# Patient Record
Sex: Male | Born: 1990 | Race: White | Hispanic: No | Marital: Single | Smoking: Current every day smoker
Health system: Southern US, Community
[De-identification: ages and names within clinical notes are randomized; demographics above are authoritative.]

## PROBLEM LIST (undated history)

## (undated) DIAGNOSIS — K047 Periapical abscess without sinus: Secondary | ICD-10-CM

---
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
## 2008-06-18 ENCOUNTER — Emergency Department: Payer: Self-pay | Admitting: Emergency Medicine

## 2008-08-16 ENCOUNTER — Emergency Department: Payer: Self-pay | Admitting: Emergency Medicine

## 2008-09-08 ENCOUNTER — Emergency Department: Payer: Self-pay | Admitting: Unknown Physician Specialty

## 2008-09-18 ENCOUNTER — Emergency Department: Payer: Self-pay | Admitting: Emergency Medicine

## 2008-10-29 ENCOUNTER — Emergency Department: Payer: Self-pay | Admitting: Emergency Medicine

## 2008-12-07 ENCOUNTER — Emergency Department: Payer: Self-pay | Admitting: Emergency Medicine

## 2009-03-04 ENCOUNTER — Emergency Department: Payer: Self-pay | Admitting: Emergency Medicine

## 2010-12-27 ENCOUNTER — Emergency Department: Payer: Self-pay | Admitting: *Deleted

## 2010-12-30 ENCOUNTER — Emergency Department: Payer: Self-pay | Admitting: Emergency Medicine

## 2011-01-07 ENCOUNTER — Emergency Department: Payer: Self-pay | Admitting: Unknown Physician Specialty

## 2013-04-27 ENCOUNTER — Emergency Department: Payer: Self-pay | Admitting: Emergency Medicine

## 2013-04-30 ENCOUNTER — Emergency Department: Payer: Self-pay | Admitting: Internal Medicine

## 2013-05-17 ENCOUNTER — Emergency Department: Payer: Self-pay | Admitting: Emergency Medicine

## 2013-07-25 ENCOUNTER — Emergency Department: Payer: Self-pay | Admitting: Emergency Medicine

## 2013-07-25 LAB — URINALYSIS, COMPLETE
BACTERIA: NONE SEEN
BLOOD: NEGATIVE
Bilirubin,UR: NEGATIVE
Glucose,UR: NEGATIVE mg/dL (ref 0–75)
Ketone: NEGATIVE
LEUKOCYTE ESTERASE: NEGATIVE
Nitrite: NEGATIVE
PH: 5 (ref 4.5–8.0)
PROTEIN: NEGATIVE
RBC,UR: <1 /HPF (ref 0–5)
SPECIFIC GRAVITY: 1.026 (ref 1.003–1.030)
Squamous Epithelial: NONE SEEN
WBC UR: <1 /HPF (ref 0–5)

## 2013-07-25 LAB — COMPREHENSIVE METABOLIC PANEL
ALK PHOS: 164 U/L — AB
ANION GAP: 1 — AB (ref 7–16)
Albumin: 4.1 g/dL (ref 3.4–5.0)
BUN: 16 mg/dL (ref 7–18)
Bilirubin,Total: 0.7 mg/dL (ref 0.2–1.0)
CO2: 30 mmol/L (ref 21–32)
Calcium, Total: 9.1 mg/dL (ref 8.5–10.1)
Chloride: 103 mmol/L (ref 98–107)
Creatinine: 0.99 mg/dL (ref 0.60–1.30)
GLUCOSE: 84 mg/dL (ref 65–99)
Osmolality: 269 (ref 275–301)
Potassium: 3.7 mmol/L (ref 3.5–5.1)
SGOT(AST): 27 U/L (ref 15–37)
SGPT (ALT): 32 U/L (ref 12–78)
Sodium: 134 mmol/L — ABNORMAL LOW (ref 136–145)
TOTAL PROTEIN: 8.1 g/dL (ref 6.4–8.2)

## 2013-07-25 LAB — CBC
HCT: 48.3 % (ref 40.0–52.0)
HGB: 16.2 g/dL (ref 13.0–18.0)
MCH: 30.4 pg (ref 26.0–34.0)
MCHC: 33.5 g/dL (ref 32.0–36.0)
MCV: 91 fL (ref 80–100)
Platelet: 174 10*3/uL (ref 150–440)
RBC: 5.34 10*6/uL (ref 4.40–5.90)
RDW: 14.6 % — ABNORMAL HIGH (ref 11.5–14.5)
WBC: 13.2 10*3/uL — ABNORMAL HIGH (ref 3.8–10.6)

## 2013-07-25 LAB — DRUG SCREEN, URINE
Amphetamines, Ur Screen: NEGATIVE (ref ?–1000)
BARBITURATES, UR SCREEN: NEGATIVE (ref ?–200)
Benzodiazepine, Ur Scrn: NEGATIVE (ref ?–200)
CANNABINOID 50 NG, UR ~~LOC~~: POSITIVE (ref ?–50)
COCAINE METABOLITE, UR ~~LOC~~: POSITIVE (ref ?–300)
MDMA (ECSTASY) UR SCREEN: NEGATIVE (ref ?–500)
Methadone, Ur Screen: NEGATIVE (ref ?–300)
OPIATE, UR SCREEN: NEGATIVE (ref ?–300)
PHENCYCLIDINE (PCP) UR S: NEGATIVE (ref ?–25)
Tricyclic, Ur Screen: NEGATIVE (ref ?–1000)

## 2013-07-25 LAB — ACETAMINOPHEN LEVEL

## 2013-07-25 LAB — SALICYLATE LEVEL: Salicylates, Serum: 2.6 mg/dL

## 2013-07-25 LAB — ETHANOL: Ethanol: <3 mg/dL

## 2013-10-08 ENCOUNTER — Emergency Department: Payer: Self-pay | Admitting: Emergency Medicine

## 2015-08-06 ENCOUNTER — Emergency Department: Payer: Self-pay

## 2015-08-06 ENCOUNTER — Emergency Department
Admission: EM | Admit: 2015-08-06 | Discharge: 2015-08-06 | Disposition: A | Discharge status: Home / Self Care | Payer: Self-pay | Attending: Emergency Medicine | Admitting: Emergency Medicine

## 2015-08-06 ENCOUNTER — Encounter: Payer: Self-pay | Admitting: Emergency Medicine

## 2015-08-06 DIAGNOSIS — R0789 Other chest pain: Secondary | ICD-10-CM

## 2015-08-06 DIAGNOSIS — F17210 Nicotine dependence, cigarettes, uncomplicated: Secondary | ICD-10-CM | POA: Insufficient documentation

## 2015-08-06 DIAGNOSIS — J069 Acute upper respiratory infection, unspecified: Secondary | ICD-10-CM

## 2015-08-06 LAB — CBC
HCT: 43.1 % (ref 40.0–52.0)
Hemoglobin: 14.3 g/dL (ref 13.0–18.0)
MCH: 28.5 pg (ref 26.0–34.0)
MCHC: 33.2 g/dL (ref 32.0–36.0)
MCV: 85.9 fL (ref 80.0–100.0)
Platelets: 147 10*3/uL — ABNORMAL LOW (ref 150–440)
RBC: 5.02 MIL/uL (ref 4.40–5.90)
RDW: 14 % (ref 11.5–14.5)
WBC: 13.1 10*3/uL — ABNORMAL HIGH (ref 3.8–10.6)

## 2015-08-06 LAB — COMPREHENSIVE METABOLIC PANEL
ALK PHOS: 110 U/L (ref 38–126)
ALT: 24 U/L (ref 17–63)
ANION GAP: 11 (ref 5–15)
AST: 26 U/L (ref 15–41)
Albumin: 4.1 g/dL (ref 3.5–5.0)
BUN: 13 mg/dL (ref 6–20)
CALCIUM: 9.3 mg/dL (ref 8.9–10.3)
CHLORIDE: 100 mmol/L — AB (ref 101–111)
CO2: 25 mmol/L (ref 22–32)
CREATININE: 0.99 mg/dL (ref 0.61–1.24)
Glucose, Bld: 125 mg/dL — ABNORMAL HIGH (ref 65–99)
Potassium: 4 mmol/L (ref 3.5–5.1)
SODIUM: 136 mmol/L (ref 135–145)
Total Bilirubin: 0.5 mg/dL (ref 0.3–1.2)
Total Protein: 7 g/dL (ref 6.5–8.1)

## 2015-08-06 LAB — TROPONIN I

## 2015-08-06 MED ORDER — ALBUTEROL SULFATE HFA 108 (90 BASE) MCG/ACT IN AERS: 2.0000 | INHALATION_SPRAY | Freq: Four times a day (QID) | RESPIRATORY_TRACT | Status: DC | PRN

## 2015-08-06 MED ORDER — CARISOPRODOL 350 MG PO TABS: 350.0000 mg | ORAL_TABLET | Freq: Three times a day (TID) | ORAL | Status: DC | PRN

## 2015-08-06 MED ORDER — KETOROLAC TROMETHAMINE 30 MG/ML IJ SOLN
30.0000 mg | Freq: Once | INTRAMUSCULAR | Status: AC
  Administered 2015-08-06: 30 mg via INTRAMUSCULAR
  Filled 2015-08-06: qty 1

## 2015-08-06 NOTE — Discharge Instructions (Signed)
Chest Wall Pain Chest wall pain is pain in or around the bones and muscles of your chest. Sometimes, an injury causes this pain. Sometimes, the cause may not be known. This pain may take several weeks or longer to get better. HOME CARE INSTRUCTIONS  Pay attention to any changes in your symptoms. Take these actions to help with your pain:   Rest as told by your health care provider.   Avoid activities that cause pain. These include any activities that use your chest muscles or your abdominal and side muscles to lift heavy items.   If directed, apply ice to the painful area:  Put ice in a plastic bag.  Place a towel between your skin and the bag.  Leave the ice on for 20 minutes, 2-3 times per day.  Take over-the-counter and prescription medicines only as told by your health care provider.  Do not use tobacco products, including cigarettes, chewing tobacco, and e-cigarettes. If you need help quitting, ask your health care provider.  Keep all follow-up visits as told by your health care provider. This is important. SEEK MEDICAL CARE IF:  You have a fever.  Your chest pain becomes worse.  You have new symptoms. SEEK IMMEDIATE MEDICAL CARE IF:  You have nausea or vomiting.  You feel sweaty or light-headed.  You have a cough with phlegm (sputum) or you cough up blood.  You develop shortness of breath.   This information is not intended to replace advice given to you by your health care provider. Make sure you discuss any questions you have with your health care provider.   Document Released: 06/09/2005 Document Revised: 02/28/2015 Document Reviewed: 09/04/2014 Elsevier Interactive Patient Education 2016 Elsevier [employer]  Upper Respiratory Infection, Adult Most upper respiratory infections (URIs) are caused by a virus. A URI affects the nose, throat, and upper air passages. The most common type of URI is often called "the common cold." HOME CARE   Take medicines only as told  by your doctor.  Gargle warm saltwater or take cough drops to comfort your throat as told by your doctor.  Use a warm mist humidifier or inhale steam from a shower to increase air moisture. This may make it easier to breathe.  Drink enough fluid to keep your pee (urine) clear or pale yellow.  Eat soups and other clear broths.  Have a healthy diet.  Rest as needed.  Go back to work when your fever is gone or your doctor says it is okay.  You may need to stay home longer to avoid giving your URI to others.  You can also wear a face mask and wash your hands often to prevent spread of the virus.  Use your inhaler more if you have asthma.  Do not use any tobacco products, including cigarettes, chewing tobacco, or electronic cigarettes. If you need help quitting, ask your doctor. GET HELP IF:  You are getting worse, not better.  Your symptoms are not helped by medicine.  You have chills.  You are getting more short of breath.  You have brown or red mucus.  You have yellow or brown discharge from your nose.  You have pain in your face, especially when you bend forward.  You have a fever.  You have puffy (swollen) neck glands.  You have pain while swallowing.  You have white areas in the back of your throat. GET HELP RIGHT AWAY IF:   You have very bad or constant:  Headache.  Ear pain.  Pain in your forehead, behind your eyes, and over your cheekbones (sinus pain). °¨ Chest pain. °· You have long-lasting (chronic) lung disease and any of the following: °¨ Wheezing. °¨ Long-lasting cough. °¨ Coughing up blood. °¨ A change in your usual mucus. °· You have a stiff neck. °· You have changes in your: °¨ Vision. °¨ Hearing. °¨ Thinking. °¨ Mood. °MAKE SURE YOU:  °· Understand these instructions. °· Will watch your condition. °· Will get help right away if you are not doing well or get worse. °  °This information is not intended to replace advice given to you by your health  care provider. Make sure you discuss any questions you have with your health care provider. °  °Document Released: 11/26/2007 Document Revised: 10/24/2014 Document Reviewed: 09/14/2013 °Elsevier Interactive Patient Education ©2016 Elsevier Inc. ° °

## 2015-08-06 NOTE — ED Notes (Signed)
States R sided chest pain x 1 week worse when coughs, hurts with movement. Denies injury. Denies fevers.

## 2015-08-06 NOTE — ED Notes (Signed)
Called for patient in waiting room.  No response.   

## 2015-08-06 NOTE — ED Provider Notes (Addendum)
[facility] Emergency Department Provider Note  ____________________________________________  Time seen: Approximately 7 PM  I have reviewed the triage vital signs and the nursing notes.   HISTORY  Chief Complaint Chest Pain    HPI Randy Michael. is a 25 y.o. male without any chronic medical problems who is presenting today with right-sided chest pain. He says the pain is been ongoing for about a week and is worse with movement and when he pushes himself up to a sitting position. He says the pain is sharp and severe at times. He says it is also worse with deep breathing. Is not associated with any nausea or vomiting. Says that he does have a cough with clear sputum production. Has been taking nitrofurantoin Tylenol without any relief. Says that he has other families were ill but is not been in for a close contact. Only a mild amount of rhinorrhea. No radiation of the chest pain.Denies taking any hormone supplementation. Denies any injury.   History reviewed. No pertinent past medical history.  There are no active problems to display for this patient.   History reviewed. No pertinent past surgical history.  No current outpatient prescriptions on file.  Allergies Sulfa antibiotics and Aspirin  No family history on file.  Social History Social History  Substance Use Topics  . Smoking status: Current Every Day Smoker -- 1.00 packs/day    Types: Cigarettes  . Smokeless tobacco: None  . Alcohol Use: Yes    Review of Systems Constitutional: No fever/chills Eyes: No visual changes. ENT: No sore throat. Cardiovascular: As above Respiratory: Denies shortness of breath. Gastrointestinal: No abdominal pain.  No nausea, no vomiting.  No diarrhea.  No constipation. Genitourinary: Negative for dysuria. Musculoskeletal: Negative for back pain. Skin: Negative for rash. Neurological: Negative for headaches, focal weakness or numbness.  10-point ROS  otherwise negative.  ____________________________________________   PHYSICAL EXAM:  VITAL SIGNS: ED Triage Vitals  Enc Vitals Group     BP 08/06/15 1649 173/99 mmHg     Pulse Rate 08/06/15 1649 86     Resp 08/06/15 1649 20     Temp 08/06/15 1649 97.7 F (36.5 C)     Temp Source 08/06/15 1649 Oral     SpO2 08/06/15 1649 100 %     Weight 08/06/15 1649 165 lb (74.844 kg)     Height 08/06/15 1649  (1.803 m)     Head Cir --      Peak Flow --      Pain Score 08/06/15 1649 9     Pain Loc --      Pain Edu? --      Excl. in GC? --     Constitutional: Alert and oriented. Well appearing and in no acute distress. Eyes: Conjunctivae are normal. PERRL. EOMI. Head: Atraumatic. Nose: No congestion/rhinnorhea. Mouth/Throat: Mucous membranes are moist.   Neck: No stridor.   Cardiovascular: Normal rate, regular rhythm. Grossly normal heart sounds.  Chest pain is reproducible with palpation on the right side. Respiratory: Normal respiratory effort.  No retractions. Lungs CTAB. Gastrointestinal: Soft and nontender. No distention. No abdominal bruits. No CVA tenderness. Musculoskeletal: No lower extremity tenderness nor edema.  No joint effusions. Neurologic:  Normal speech and language. No gross focal neurologic deficits are appreciated. No gait instability. Skin:  Skin is warm, dry and intact. No rash noted. Psychiatric: Mood and affect are normal. Speech and behavior are normal.  ____________________________________________   LABS (all labs ordered are listed,  but only abnormal results are displayed)  Labs Reviewed  CBC - Abnormal; Notable for the following:    WBC 13.1 (*)    Platelets 147 (*)    All other components within normal limits  COMPREHENSIVE METABOLIC PANEL - Abnormal; Notable for the following:    Chloride 100 (*)    Glucose, Bld 125 (*)    All other components within normal limits  TROPONIN I   ____________________________________________  EKG  ED ECG  REPORT I, Arelia Longest, the attending physician, personally viewed and interpreted this ECG.   Date: 08/06/2015  EKG Time: 1657  Rate: 83  Rhythm: normal sinus rhythm  Axis: Normal axis  Intervals:none  ST&T Change: Single T-wave inversion in aVL. No ST elevation or depression.  ____________________________________________  RADIOLOGY  Minimal bronchitic changes on the chest x-ray. ____________________________________________   PROCEDURES  ____________________________________________   INITIAL IMPRESSION / ASSESSMENT AND PLAN / ED COURSE  Pertinent labs & imaging results that were available during my care of the patient were reviewed by me and considered in my medical decision making (see chart for details).  Patient is PE RC negative.  Also with very reassuring lab workup. Likely chest wall pain. Likely pain secondary to URI. We'll give dose of Toradol here. We'll give her inhaler at home secondary to the patient saying he has a lot of coughing in the evening. We'll also give a muscle relaxer. knows to use muscle cream such as icy hot or Aspercreme for further relief. Will be discharged home. ____________________________________________   FINAL CLINICAL IMPRESSION(S) / ED DIAGNOSES  URI. Chest wall pain.    Myrna Blazer, MD 08/06/15 [phone]  Patient also with blood pressure elevated but in the context of painful stimuli. We'll refer her to outpatient clinic for follow-up.  Myrna Blazer, MD 08/06/15 Jerene Bears

## 2015-10-10 ENCOUNTER — Encounter: Payer: Self-pay | Admitting: Medical Oncology

## 2015-10-10 ENCOUNTER — Emergency Department: Payer: Self-pay

## 2015-10-10 ENCOUNTER — Emergency Department
Admission: EM | Admit: 2015-10-10 | Discharge: 2015-10-10 | Disposition: A | Discharge status: Home / Self Care | Payer: Self-pay | Attending: Emergency Medicine | Admitting: Emergency Medicine

## 2015-10-10 DIAGNOSIS — Y939 Activity, unspecified: Secondary | ICD-10-CM | POA: Insufficient documentation

## 2015-10-10 DIAGNOSIS — S62346A Nondisplaced fracture of base of fifth metacarpal bone, right hand, initial encounter for closed fracture: Secondary | ICD-10-CM | POA: Insufficient documentation

## 2015-10-10 DIAGNOSIS — Y999 Unspecified external cause status: Secondary | ICD-10-CM | POA: Insufficient documentation

## 2015-10-10 DIAGNOSIS — W1839XA Other fall on same level, initial encounter: Secondary | ICD-10-CM | POA: Insufficient documentation

## 2015-10-10 DIAGNOSIS — Z7951 Long term (current) use of inhaled steroids: Secondary | ICD-10-CM | POA: Insufficient documentation

## 2015-10-10 DIAGNOSIS — Y929 Unspecified place or not applicable: Secondary | ICD-10-CM | POA: Insufficient documentation

## 2015-10-10 DIAGNOSIS — F17210 Nicotine dependence, cigarettes, uncomplicated: Secondary | ICD-10-CM | POA: Insufficient documentation

## 2015-10-10 DIAGNOSIS — S62339A Displaced fracture of neck of unspecified metacarpal bone, initial encounter for closed fracture: Secondary | ICD-10-CM

## 2015-10-10 MED ORDER — TRAMADOL HCL 50 MG PO TABS: 50.0000 mg | ORAL_TABLET | Freq: Four times a day (QID) | ORAL | Status: DC | PRN

## 2015-10-10 NOTE — ED Provider Notes (Signed)
CSN: [id number]     Arrival date & time 10/10/15  1310 History   First MD Initiated Contact with Patient 10/10/15 1330     Chief Complaint  Patient presents with  . Hand Pain     HPI   25 year old male who reports that he "fell on the concrete and landed on his right hand last night." He has swelling near the proximal fifth metacarpal. He denies history of boxer's fracture. He has not taken anything today to help with the pain.  History reviewed. No pertinent past medical history. History reviewed. No pertinent past surgical history. No family history on file. Social History  Substance Use Topics  . Smoking status: Current Every Day Smoker -- 1.00 packs/day    Types: Cigarettes  . Smokeless tobacco: None  . Alcohol Use: Yes    Review of Systems  Constitutional: Negative.   Respiratory: Negative for shortness of breath.   Musculoskeletal: Positive for myalgias. Negative for neck pain.  Skin: Negative for color change and wound.  Neurological: Negative for numbness.      Allergies  Sulfa antibiotics and Aspirin  Home Medications   Prior to Admission medications   Medication Sig Start Date End Date Taking? Authorizing Provider  albuterol (PROVENTIL HFA;VENTOLIN HFA) 108 (90 Base) MCG/ACT inhaler Inhale 2 puffs into the lungs every 6 (six) hours as needed for wheezing or shortness of breath. 08/06/15   Myrna Blazeravid Matthew Schaevitz, MD  carisoprodol (SOMA) 350 MG tablet Take 1 tablet (350 mg total) by mouth 3 (three) times daily as needed for muscle spasms. 08/06/15   Myrna Blazeravid Matthew Schaevitz, MD  traMADol (ULTRAM) 50 MG tablet Take 1 tablet (50 mg total) by mouth every 6 (six) hours as needed. 10/10/15   Merel Santoli B Anselmo Reihl, FNP   BP 152/98 mmHg  Pulse 84  Temp(Src) 98.1 F (36.7 C) (Oral)  Resp 18  Ht 5\' 11"  (1.803 m)  Wt 74.844 kg  BMI 23.02 kg/m2  SpO2 100% Physical Exam  Constitutional: He is oriented to person, place, and time. He appears well-developed.  Eyes: EOM are  normal.  Neck: Normal range of motion.  Pulmonary/Chest: Effort normal.  Musculoskeletal:  Bony tenderness over the proximal fifth metacarpal with obvious deformity. Radial pulse in the right wrist is 2+. There is no open lesion in association with the deformity.  Neurological: He is alert and oriented to person, place, and time.  Skin: Skin is warm and dry. No erythema.  Psychiatric: He has a normal mood and affect. His behavior is normal. Judgment and thought content normal.  Nursing note and vitals reviewed.   ED Course  .Splint Application Date/Time: 10/10/2015 3:00 PM Performed by: Jari SportsmanBANGLE, TAMMY Authorized by: Kem BoroughsRIPLETT, Leyton Brownlee B Consent: Verbal consent obtained. Consent given by: patient Location details: right hand Post-procedure: The splinted body part was neurovascularly unchanged following the procedure. Patient tolerance: Patient tolerated the procedure well with no immediate complications   (including critical care time) Labs Review Labs Reviewed - No data to display  Imaging Review Dg Hand Complete Right  10/10/2015  CLINICAL DATA:  25 year old male with history of trauma from a fall yesterday evening onto a sidewalk with injury to the right hand complaining of pain and swelling in the right hand since that time. EXAM: RIGHT HAND - COMPLETE 3+ VIEW COMPARISON:  Right thumb radiograph 10/08/2013. Right hand radiograph 12/07/2008. FINDINGS: Subtle nondisplaced fracture through the base of the fifth metacarpal. Mild posttraumatic deformity of the distal aspect of the fifth metacarpal related to  an old healed boxer's fracture. No other acute displaced fracture, subluxation or dislocation is noted. Soft tissue swelling adjacent to the fifth metacarpal is noted. IMPRESSION: 1. Acute nondisplaced fracture through the base of the fifth metacarpal. Electronically Signed   By: Trudie Reed M.D.   On: 10/10/2015 13:56   I have personally reviewed and evaluated these images and lab  results as part of my medical decision-making.   EKG Interpretation None      MDM   Final diagnoses:  Boxer's fracture, closed, initial encounter    Patient to follow up with orthopedics. He is to return to the ER for symptoms of concern if unable to schedule an appointment with the specialist.    Chinita Pester, FNP 10/10/15 1534  Myrna Blazer, MD 10/10/15 [phone]

## 2015-10-10 NOTE — Discharge Instructions (Signed)
Cast or Splint Care °Casts and splints support injured limbs and keep bones from moving while they heal.  °HOME CARE °· Keep the cast or splint uncovered during the drying period. °¨ A plaster cast can take 24 to 48 hours to dry. °¨ A fiberglass cast will dry in less than 1 hour. °· Do not rest the cast on anything harder than a pillow for 24 hours. °· Do not put weight on your injured limb. Do not put pressure on the cast. Wait for your doctor's approval. °· Keep the cast or splint dry. °¨ Cover the cast or splint with a plastic bag during baths or wet weather. °¨ If you have a cast over your chest and belly (trunk), take sponge baths until the cast is taken off. °¨ If your cast gets wet, dry it with a towel or blow dryer. Use the cool setting on the blow dryer. °· Keep your cast or splint clean. Wash a dirty cast with a damp cloth. °· Do not put any objects under your cast or splint. °· Do not scratch the skin under the cast with an object. If itching is a problem, use a blow dryer on a cool setting over the itchy area. °· Do not trim or cut your cast. °· Do not take out the padding from inside your cast. °· Exercise your joints near the cast as told by your doctor. °· Raise (elevate) your injured limb on 1 or 2 pillows for the first 1 to 3 days. °GET HELP IF: °· Your cast or splint cracks. °· Your cast or splint is too tight or too loose. °· You itch badly under the cast. °· Your cast gets wet or has a soft spot. °· You have a bad smell coming from the cast. °· You get an object stuck under the cast. °· Your skin around the cast becomes red or sore. °· You have new or more pain after the cast is put on. °GET HELP RIGHT AWAY IF: °· You have fluid leaking through the cast. °· You cannot move your fingers or toes. °· Your fingers or toes turn blue or white or are cool, painful, or puffy (swollen). °· You have tingling or lose feeling (numbness) around the injured area. °· You have bad pain or pressure under the  cast. °· You have trouble breathing or have shortness of breath. °· You have chest pain. °  °This information is not intended to replace advice given to you by your health care provider. Make sure you discuss any questions you have with your health care provider. °  °Document Released: 10/09/2010 Document Revised: 02/09/2013 Document Reviewed: 12/16/2012 °Elsevier Interactive Patient Education ©2016 Elsevier Inc. ° °

## 2015-10-10 NOTE — ED Notes (Signed)
Pt reports he fell on his rt hand last night. Swelling noted.

## 2017-01-29 IMAGING — CR DG CHEST 2V
1 series · 2 of 2 positions shown · non-contrast
Comparison: 12/27/2010

CLINICAL DATA: RIGHT-sided chest pain for 1 week, smoker

EXAM:
CHEST  2 VIEW

[Series 1: w chest pa · 0.14mm/px · 2 of 2 slices shown]
[im 1/2]
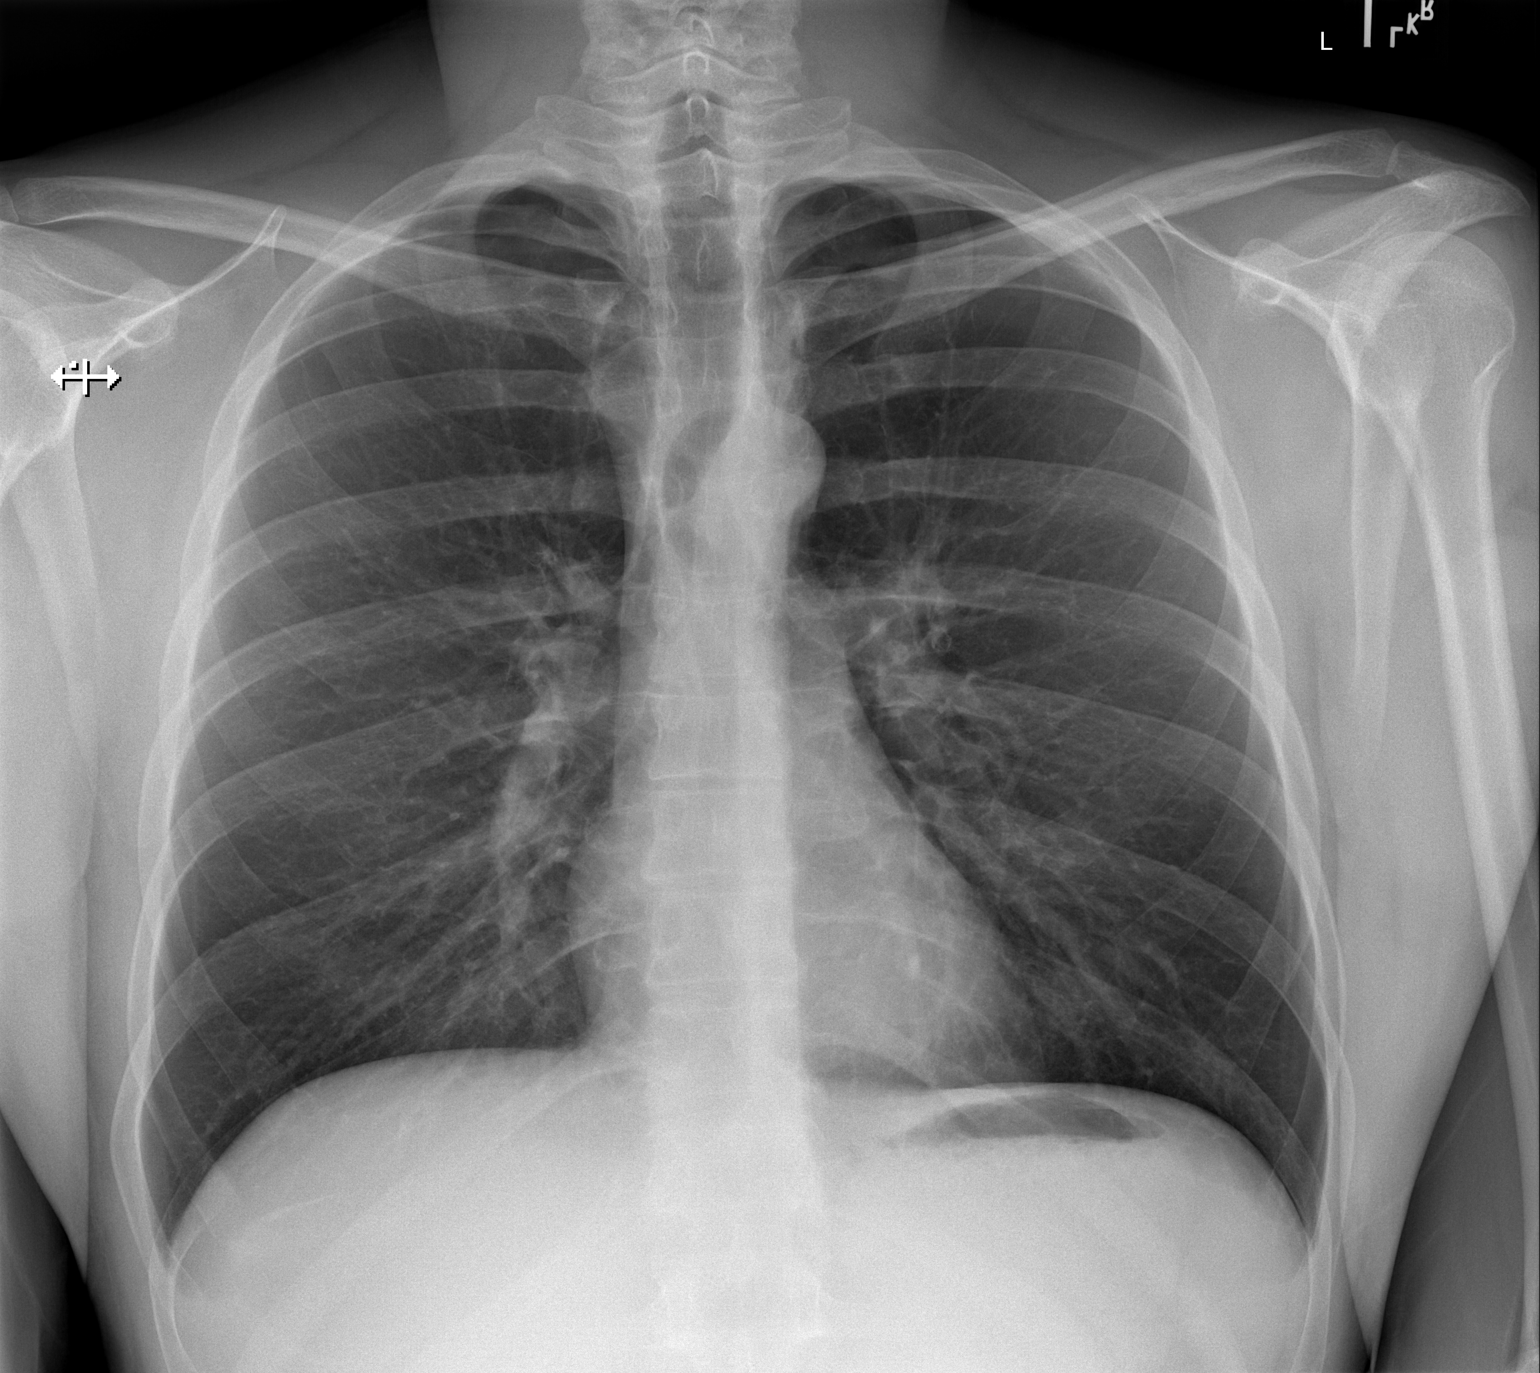
[im 2/2]
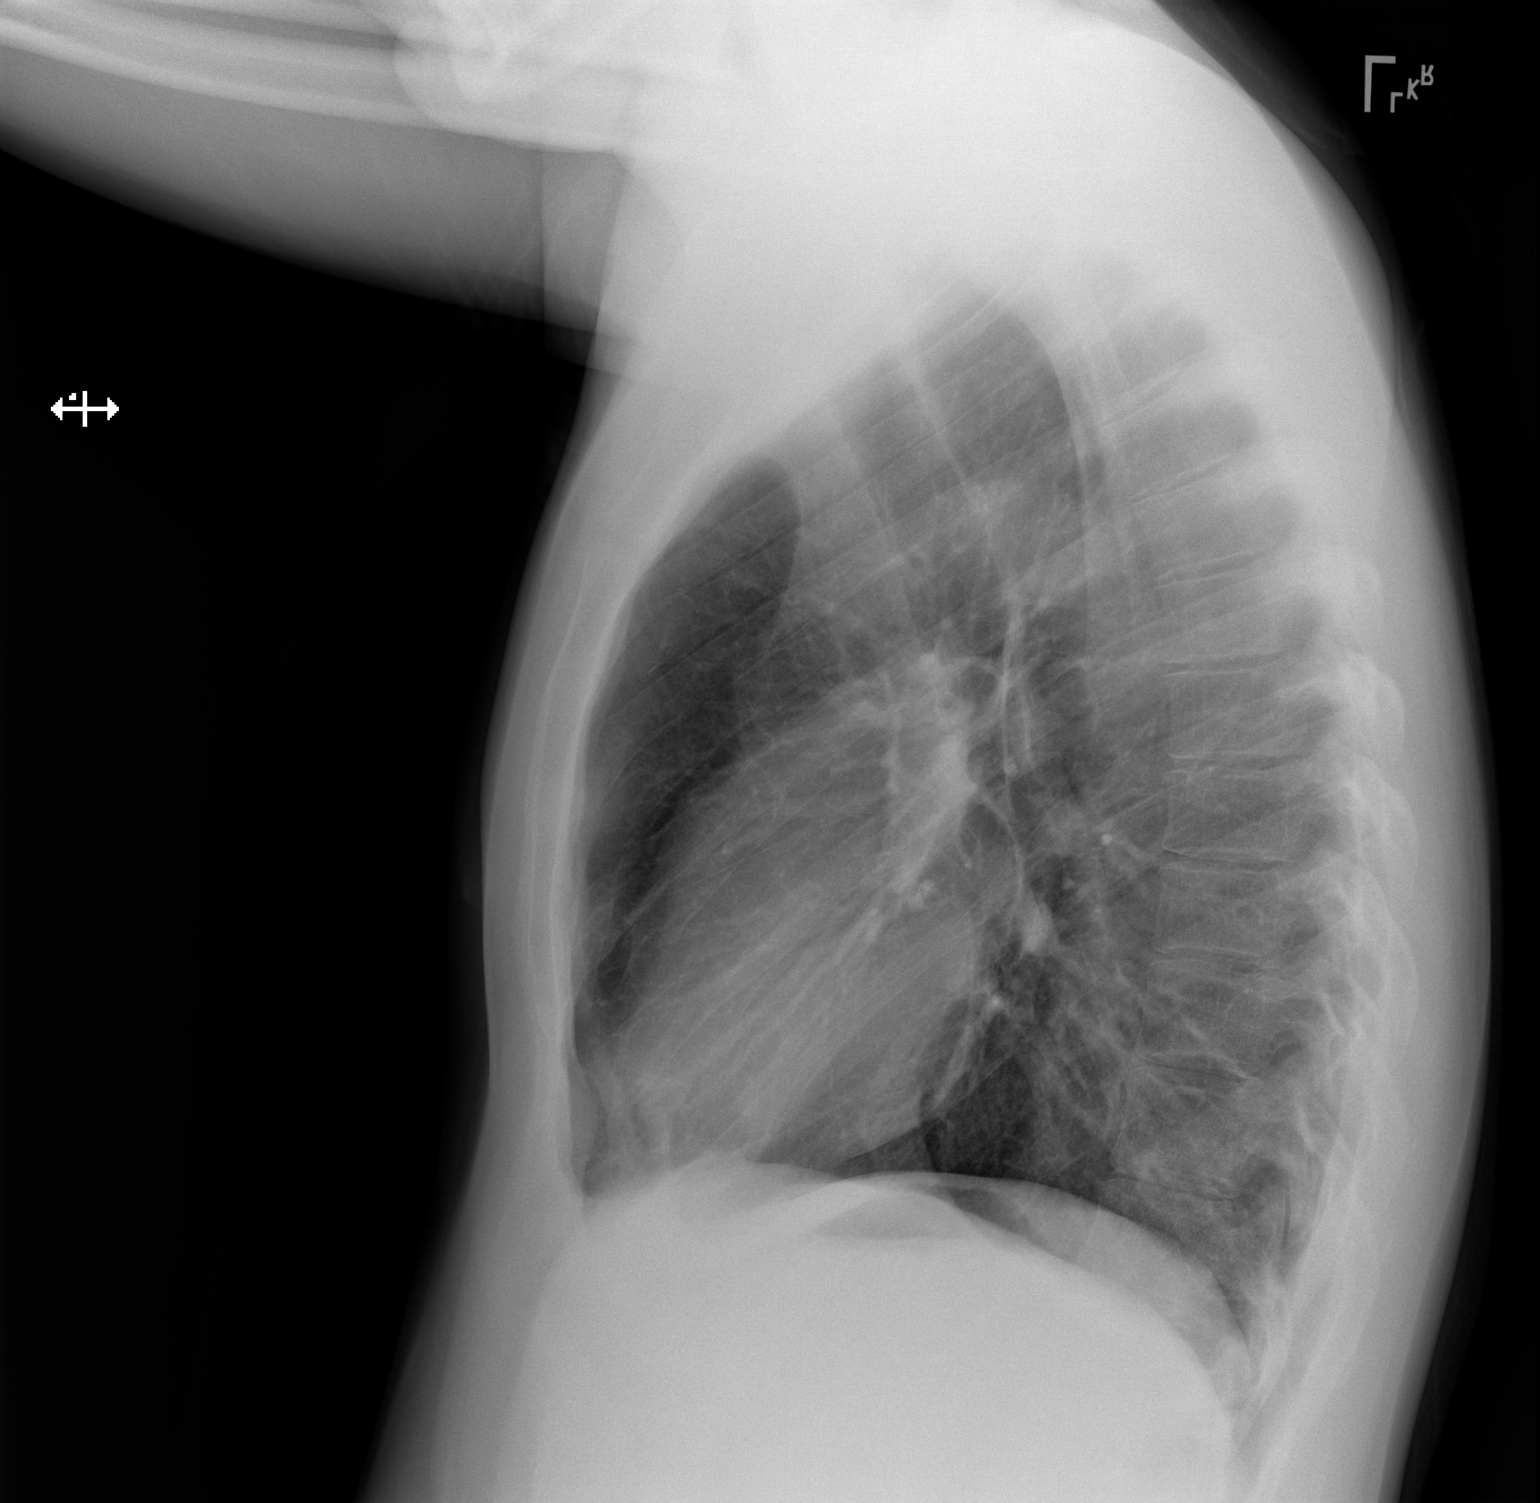

[2 of 2 positions shown; findings below may reference images not displayed]

FINDINGS: Normal heart size, mediastinal contours, and pulmonary vascularity.

Minimal peribronchial thickening.

No pulmonary infiltrate, pleural effusion or pneumothorax.

Bones unremarkable.
IMPRESSION: Minimal bronchitic changes.

## 2017-06-14 ENCOUNTER — Emergency Department: Payer: Self-pay

## 2017-06-14 ENCOUNTER — Other Ambulatory Visit: Payer: Self-pay

## 2017-06-14 ENCOUNTER — Emergency Department
Admission: EM | Admit: 2017-06-14 | Discharge: 2017-06-14 | Disposition: A | Discharge status: Home / Self Care | Payer: Self-pay | Attending: Emergency Medicine | Admitting: Emergency Medicine

## 2017-06-14 ENCOUNTER — Encounter: Payer: Self-pay | Admitting: Emergency Medicine

## 2017-06-14 DIAGNOSIS — F17210 Nicotine dependence, cigarettes, uncomplicated: Secondary | ICD-10-CM | POA: Insufficient documentation

## 2017-06-14 DIAGNOSIS — Y92000 Kitchen of unspecified non-institutional (private) residence as  the place of occurrence of the external cause: Secondary | ICD-10-CM | POA: Insufficient documentation

## 2017-06-14 DIAGNOSIS — Y939 Activity, unspecified: Secondary | ICD-10-CM | POA: Insufficient documentation

## 2017-06-14 DIAGNOSIS — J0100 Acute maxillary sinusitis, unspecified: Secondary | ICD-10-CM | POA: Insufficient documentation

## 2017-06-14 DIAGNOSIS — S0990XA Unspecified injury of head, initial encounter: Secondary | ICD-10-CM

## 2017-06-14 DIAGNOSIS — S0101XA Laceration without foreign body of scalp, initial encounter: Secondary | ICD-10-CM | POA: Insufficient documentation

## 2017-06-14 DIAGNOSIS — Y999 Unspecified external cause status: Secondary | ICD-10-CM | POA: Insufficient documentation

## 2017-06-14 MED ORDER — LIDOCAINE-EPINEPHRINE (PF) 1 %-1:200000 IJ SOLN
INTRAMUSCULAR | Status: AC
  Administered 2017-06-14: 5 mL
  Filled 2017-06-14: qty 30

## 2017-06-14 MED ORDER — AMOXICILLIN 500 MG PO CAPS: 500.0000 mg | ORAL_CAPSULE | Freq: Three times a day (TID) | ORAL | 0 refills | Status: DC

## 2017-06-14 NOTE — ED Notes (Signed)
Patient declined recheck of vital signs. 
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
## 2017-06-14 NOTE — ED Notes (Signed)
Dr. Darnelle CatalanMalinda aware of vital signs. Patient's head wounds were cleaned. Patient tolerated procedure well.
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
## 2017-06-23 ENCOUNTER — Other Ambulatory Visit: Payer: Self-pay

## 2017-06-23 ENCOUNTER — Emergency Department
Admission: EM | Admit: 2017-06-23 | Discharge: 2017-06-23 | Disposition: A | Discharge status: Home / Self Care | Payer: Self-pay | Attending: Emergency Medicine | Admitting: Emergency Medicine

## 2017-06-23 ENCOUNTER — Encounter: Payer: Self-pay | Admitting: Emergency Medicine

## 2017-06-23 DIAGNOSIS — F17210 Nicotine dependence, cigarettes, uncomplicated: Secondary | ICD-10-CM | POA: Insufficient documentation

## 2017-06-23 DIAGNOSIS — K047 Periapical abscess without sinus: Secondary | ICD-10-CM | POA: Insufficient documentation

## 2017-06-23 MED ORDER — CLINDAMYCIN HCL 150 MG PO CAPS: ORAL_CAPSULE | ORAL | 0 refills | Status: DC

## 2017-06-23 MED ORDER — OXYCODONE-ACETAMINOPHEN 5-325 MG PO TABS
1.0000 | ORAL_TABLET | Freq: Once | ORAL | Status: AC
  Administered 2017-06-23: 1 via ORAL
  Filled 2017-06-23: qty 1

## 2017-06-23 MED ORDER — OXYCODONE-ACETAMINOPHEN 5-325 MG PO TABS: 1.0000 | ORAL_TABLET | Freq: Four times a day (QID) | ORAL | 0 refills | Status: DC | PRN

## 2017-06-23 MED ORDER — CLINDAMYCIN PHOSPHATE 300 MG/2ML IJ SOLN
600.0000 mg | Freq: Once | INTRAMUSCULAR | Status: AC
  Administered 2017-06-23: 600 mg via INTRAMUSCULAR
  Filled 2017-06-23: qty 4

## 2017-06-23 NOTE — Discharge Instructions (Signed)
Follow-up with one of the dentist listed on your dental papers. Begin taking antibiotics until completely finished.  OPTIONS FOR DENTAL FOLLOW UP CARE  [place] [facility] - Local Safety Net Dental Clinics [email or web address]   [facility] [phone])  [facility] [phone])  [place] [phone] ext [phone])  [facility] [phone])  [facility] [phone]) This clinic caters to the indigent population and is on a lottery system. Location: [employer] [facility], [employer], [address], [place] Clinic Hours: Wednesdays from 6pm - 9pm, patients seen by a lottery system. For dates, call or go to [email or web address] Services: Cleanings, fillings and simple extractions. Payment Options: DENTAL WORK IS FREE OF CHARGE. Bring proof of income or support. Best way to get seen: Arrive at 5:15 pm - this is a lottery, NOT first come/first serve, so arriving earlier will not increase your chances of being seen.     [facility] [phone] Select option 1 for emergencies   Location: [facility], [place], [address] [place] Clinic Hours: No walk-ins accepted - call the day before to schedule an appointment. Check in times are 9:30 am and 1:30 pm. Services: Simple extractions, temporary fillings, pulpectomy/pulp debridement, uncomplicated abscess drainage. Payment Options: PAYMENT IS DUE AT THE TIME OF SERVICE.  Fee is usually $100-200, additional surgical procedures (e.g. abscess drainage) may be extra. Cash, checks, Visa/MasterCard accepted.  Can file Medicaid if patient is covered for dental - patient should call case worker to check. No discount for [facility] patients. Best way to get seen: MUST call the day before and get onto the schedule. Can  usually be seen the next 1-2 days. No walk-ins accepted.     [facility] [phone]   Location: [facility], [address], [place] Clinic Hours: M, W, Th, F 8am or 1:30pm, Tues 9a or 1:30 - first come/first served. Services: Simple extractions, temporary fillings, uncomplicated abscess drainage.  You do not need to be an [facility] resident. Payment Options: PAYMENT IS DUE AT THE TIME OF SERVICE. Dental insurance, otherwise sliding scale - bring proof of income or support. Depending on income and treatment needed, cost is usually $50-200. Best way to get seen: Arrive early as it is first come/first served.     [facility] [phone]   Location: [address] Clinic Hours: Mon-Thu 8a-5p Services: Most basic dental services including extractions and fillings. Payment Options: PAYMENT IS DUE AT THE TIME OF SERVICE. Sliding scale, up to 50% off - bring proof if income or support. Medicaid with dental option accepted. Best way to get seen: Call to schedule an appointment, can usually be seen within 2 weeks OR they will try to see walk-ins - show up at 8a or 2p (you may have to wait).     [facility] [phone] [place] RESIDENTS ONLY   Location: [facility], [address], [place], [place] [address] Clinic Hours: By appointment only. Monday - Thursday 8am-5pm, Friday 8am-12pm Services: Cleanings, fillings, extractions. Payment Options: PAYMENT IS DUE AT THE TIME OF SERVICE. Cash, Visa or MasterCard. Sliding scale - $30 minimum per service. Best way to get seen: Come in to office, complete packet and make an appointment - need proof of income or support monies for each household member and proof of [facility] residence. Usually takes about a month to  get in.     [facility] [phone]   Location: [address] [facility] Hours: Walk-in Urgent Care Dental Services are offered Monday-Friday mornings only. The numbers of emergencies accepted daily is limited to the number of providers available. Maximum 15 - Mondays, Wednesdays & Thursdays Maximum 10 - Tuesdays & Fridays Services: You do not need to be a [place] resident to be seen for a dental emergency. Emergencies are defined as pain, swelling, abnormal bleeding, or dental trauma. Walkins will receive x-rays if needed. NOTE: Dental cleaning is not an emergency. Payment Options: PAYMENT IS DUE AT THE TIME OF SERVICE. Minimum co-pay is $40.00 for uninsured patients. Minimum co-pay is $3.00 for Medicaid with dental coverage. Dental Insurance is accepted and must be presented at time of visit. Medicare does not cover dental. Forms of payment: Cash, credit card, checks. Best way to get seen: If not previously registered with the clinic, walk-in dental registration begins at 7:15 am and is on a first come/first serve basis. If previously registered with the clinic, call to make an appointment.     [facility] [phone] [place] RESIDENTS ONLY   Location: [address], [place], [place] Clinic Hours: Mon-Thu 10a-2p Services: Extractions only! Payment Options: FREE (donations accepted) - bring proof of income or support Best way to get seen: Call and schedule an appointment OR come at 8am on the 1st Monday of every month (except for holidays) when it is first come/first served.     [facility] [phone]   Location: [address], [place] Clinic Hours: Friday mornings Services, Payment Options, Best way to get seen: Call for info

## 2017-06-23 NOTE — ED Triage Notes (Signed)
R lower dental pain since yesterday, face swollen.

## 2017-06-23 NOTE — ED Notes (Signed)
See triage note  Presents with dental pain  Has some swelling to right side of face
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
## 2017-09-16 ENCOUNTER — Emergency Department
Admission: EM | Admit: 2017-09-16 | Discharge: 2017-09-16 | Disposition: A | Discharge status: Home / Self Care | Payer: Self-pay | Attending: Emergency Medicine | Admitting: Emergency Medicine

## 2017-09-16 ENCOUNTER — Other Ambulatory Visit: Payer: Self-pay

## 2017-09-16 DIAGNOSIS — L0291 Cutaneous abscess, unspecified: Secondary | ICD-10-CM

## 2017-09-16 DIAGNOSIS — L02214 Cutaneous abscess of groin: Secondary | ICD-10-CM | POA: Insufficient documentation

## 2017-09-16 DIAGNOSIS — F17210 Nicotine dependence, cigarettes, uncomplicated: Secondary | ICD-10-CM | POA: Insufficient documentation

## 2017-09-16 MED ORDER — CLINDAMYCIN HCL 150 MG PO CAPS
ORAL_CAPSULE | ORAL | 0 refills | Status: DC
Start: 2017-09-16 — End: 2018-10-13

## 2017-09-16 NOTE — ED Provider Notes (Signed)
[facility] Emergency Department Provider Note  ____________________________________________   First MD Initiated Contact with Patient 09/16/17 1322     (approximate)  I have reviewed the triage vital signs and the nursing notes.   HISTORY  Chief Complaint Leg Pain and Abscess   HPI Randy Michael. is a 27 y.o. male is here with complaint of abscess in the groin area.  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  He had clindamycin left over from a previous prescription and took 2 capsules which was not enough.  He has been using warm compresses and opened the area so that it could drain.  He basically is here because he needs more antibiotics.  He rates his pain as an 8 out of 10.  History reviewed. No pertinent past medical history.  Patient Active Problem List   Diagnosis Date Noted  . Scalp laceration 06/14/2017    History reviewed. No pertinent surgical history.  Prior to Admission medications   Medication Sig Start Date End Date Taking? Authorizing Provider  clindamycin (CLEOCIN) 150 MG capsule Take 2 capsules every 6 hours until finished 09/16/17   Bridget Hartshorn L, PA-C    Allergies Sulfa antibiotics and Aspirin  No family history on file.  Social History Social History   Tobacco Use  . Smoking status: Current Every Day Smoker    Packs/day: 1.00    Types: Cigarettes  . Smokeless tobacco: Never Used  Substance Use Topics  . Alcohol use: Yes  . Drug use: Never    Review of Systems Constitutional: No fever/chills Cardiovascular: Denies chest pain. Respiratory: Denies shortness of breath. Musculoskeletal: Negative for back pain. Skin: Positive for abscess. Neurological: Negative for headaches ____________________________________________   PHYSICAL EXAM:  VITAL SIGNS: ED Triage Vitals  Enc Vitals Group     BP 09/16/17 1242 (!) 128/98     Pulse Rate 09/16/17 1242 78     Resp 09/16/17 1242 20     Temp  09/16/17 1242 98.5 F (36.9 C)     Temp Source 09/16/17 1242 Oral     SpO2 09/16/17 1242 98 %     Weight 09/16/17 1242 160 lb (72.6 kg)     Height 09/16/17 1242 5\' 11"  (1.803 m)     Head Circumference --      Peak Flow --      Pain Score 09/16/17 1248 8     Pain Loc --      Pain Edu? --      Excl. in GC? --    Constitutional: Alert and oriented. Well appearing and in no acute distress. Eyes: Conjunctivae are normal.  Head: Atraumatic. Neck: No stridor.   Cardiovascular: Normal rate, regular rhythm. Grossly normal heart sounds.  Good peripheral circulation. Respiratory: Normal respiratory effort.  No retractions. Lungs CTAB. Gastrointestinal: Soft and nontender. No distention.  Musculoskeletal: Moves upper and lower extremities without any difficulty.  Normal gait was noted. Neurologic:  Normal speech and language. No gross focal neurologic deficits are appreciated.  Skin:  Skin is warm, dry.  There is an erythematous firm area midline of the pubic area.  No active drainage at present.  Area does have evidence of recent drainage. Psychiatric: Mood and affect are normal. Speech and behavior are normal.  ____________________________________________   LABS (all labs ordered are listed, but only abnormal results are displayed)  Labs Reviewed - No data to display  PROCEDURES  Procedure(s) performed: None  Procedures  Critical Care performed: No  ____________________________________________   INITIAL IMPRESSION / ASSESSMENT AND PLAN / ED COURSE Patient is here for abscess.  Patient has a history of MRSA and has been treated multiple times for this.  Patient was given prescription for clindamycin to take as directed until completely finished.  Continue warm compresses.  He is to follow-up with his [facility]CP at [facility] if any continued problems. ____________________________________________   FINAL CLINICAL IMPRESSION(S) / ED DIAGNOSES  Final diagnoses:  Abscess     ED  Discharge Orders        Ordered    clindamycin (CLEOCIN) 150 MG capsule     09/16/17 1344       Note:  This document was prepared using Dragon voice recognition software and may include unintentional dictation errors.    Tommi RumpsSummers, Rhonda L, PA-C 09/16/17 1355    Governor RooksLord, Rebecca, MD 09/16/17 [phone]

## 2017-09-16 NOTE — Discharge Instructions (Signed)
Continue putting warm compresses to the area frequently.  Begin taking clindamycin as directed.  Follow-up with your primary care provider at [facility] if any continued problems.

## 2017-09-16 NOTE — ED Triage Notes (Signed)
Pt states he has a history of MRSA and today he presents with abscess to top left leg with pain

## 2017-12-20 ENCOUNTER — Other Ambulatory Visit: Payer: Self-pay

## 2017-12-20 ENCOUNTER — Emergency Department
Admission: EM | Admit: 2017-12-20 | Discharge: 2017-12-20 | Disposition: A | Discharge status: Home / Self Care | Payer: Self-pay

## 2017-12-20 ENCOUNTER — Emergency Department
Admission: EM | Admit: 2017-12-20 | Discharge: 2017-12-20 | Disposition: A | Discharge status: Home / Self Care | Payer: Self-pay | Attending: Emergency Medicine | Admitting: Emergency Medicine

## 2017-12-20 ENCOUNTER — Emergency Department: Payer: Self-pay

## 2017-12-20 DIAGNOSIS — Y92414 Local residential or business street as the place of occurrence of the external cause: Secondary | ICD-10-CM | POA: Insufficient documentation

## 2017-12-20 DIAGNOSIS — Y998 Other external cause status: Secondary | ICD-10-CM | POA: Insufficient documentation

## 2017-12-20 DIAGNOSIS — S0083XA Contusion of other part of head, initial encounter: Secondary | ICD-10-CM | POA: Insufficient documentation

## 2017-12-20 DIAGNOSIS — Y9301 Activity, walking, marching and hiking: Secondary | ICD-10-CM | POA: Insufficient documentation

## 2017-12-20 DIAGNOSIS — S0101XA Laceration without foreign body of scalp, initial encounter: Secondary | ICD-10-CM | POA: Insufficient documentation

## 2017-12-20 DIAGNOSIS — F17210 Nicotine dependence, cigarettes, uncomplicated: Secondary | ICD-10-CM | POA: Insufficient documentation

## 2017-12-20 MED ORDER — OXYCODONE-ACETAMINOPHEN 5-325 MG PO TABS: 1.0000 | ORAL_TABLET | Freq: Once | ORAL | Status: DC

## 2017-12-20 MED ORDER — OXYCODONE-ACETAMINOPHEN 5-325 MG PO TABS
ORAL_TABLET | ORAL | Status: AC
  Filled 2017-12-20: qty 1

## 2017-12-20 NOTE — ED Notes (Signed)
[facility]

## 2017-12-20 NOTE — ED Notes (Signed)
Verbal orders for CT scans from Dr. Marisa SeverinSiadecki. Denies needing blood work at this time. Pt given ice pack to apply to face.
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
## 2017-12-20 NOTE — ED Notes (Addendum)
Pt called to triage, no answer.
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
## 2017-12-20 NOTE — ED Notes (Signed)
This RN went to bedside to give medication and D/C patient, patient eloped from room prior to receiving D/C instructions. MD made aware.
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
## 2018-10-13 ENCOUNTER — Other Ambulatory Visit: Payer: Self-pay

## 2018-10-13 ENCOUNTER — Emergency Department
Admission: EM | Admit: 2018-10-13 | Discharge: 2018-10-13 | Disposition: A | Discharge status: Home / Self Care | Payer: Self-pay | Attending: Emergency Medicine | Admitting: Emergency Medicine

## 2018-10-13 ENCOUNTER — Encounter: Payer: Self-pay | Admitting: Emergency Medicine

## 2018-10-13 DIAGNOSIS — K047 Periapical abscess without sinus: Secondary | ICD-10-CM | POA: Insufficient documentation

## 2018-10-13 DIAGNOSIS — F17210 Nicotine dependence, cigarettes, uncomplicated: Secondary | ICD-10-CM | POA: Insufficient documentation

## 2018-10-13 HISTORY — DX: Periapical abscess without sinus: K04.7

## 2018-10-13 MED ORDER — CLINDAMYCIN HCL 150 MG PO CAPS
450.0000 mg | ORAL_CAPSULE | Freq: Once | ORAL | Status: AC
  Administered 2018-10-13: 450 mg via ORAL
  Filled 2018-10-13: qty 3

## 2018-10-13 MED ORDER — OXYCODONE-ACETAMINOPHEN 5-325 MG PO TABS: 2.0000 | ORAL_TABLET | Freq: Four times a day (QID) | ORAL | 0 refills | Status: DC | PRN

## 2018-10-13 MED ORDER — DEXAMETHASONE 10 MG/ML FOR PEDIATRIC ORAL USE
10.0000 mg | Freq: Once | INTRAMUSCULAR | Status: AC
  Administered 2018-10-13: 10 mg via ORAL
  Filled 2018-10-13: qty 1

## 2018-10-13 MED ORDER — CLINDAMYCIN HCL 150 MG PO CAPS: 450.0000 mg | ORAL_CAPSULE | Freq: Three times a day (TID) | ORAL | 0 refills | Status: AC

## 2018-10-13 MED ORDER — OXYCODONE-ACETAMINOPHEN 5-325 MG PO TABS
2.0000 | ORAL_TABLET | Freq: Once | ORAL | Status: AC
  Administered 2018-10-13: 2 via ORAL
  Filled 2018-10-13: qty 2

## 2018-10-13 NOTE — ED Triage Notes (Signed)
Patient ambulatory to triage with steady gait, without difficulty or distress noted; pt reports right lower dental pain with swelling

## 2018-10-13 NOTE — Discharge Instructions (Signed)
You have been seen in the Emergency Department (ED) today for dental pain.  Please take your prescribed antibiotic.  You may take pain medication as needed but ONLY as prescribed.  You should also take over-the-counter pain medication such as ibuprofen according to the label instructions unless a doctor has previously told you to avoid this type of medication (due to stomach ulcers, for example).  Alternatively you can take ibuprofen 600 mg by mouth three times daily with meals for no more than 5 days. ° °Take Percocet as prescribed for severe pain. Do not drink alcohol, drive or participate in any other potentially dangerous activities while taking this medication as it may make you sleepy. Do not take this medication with any other sedating medications, either prescription or over-the-counter. If you were prescribed Percocet or Vicodin, do not take these with acetaminophen (Tylenol) as it is already contained within these medications. °  °This medication is an opiate (or narcotic) pain medication and can be habit forming.  Use it as little as possible to achieve adequate pain control.  Do not use or use it with extreme caution if you have a history of opiate abuse or dependence.  If you are on a pain contract with your primary care doctor or a pain specialist, be sure to let them know you were prescribed this medication today from the [facility].  This medication is intended for your use only - do not give any to anyone else and keep it in a secure place where nobody else, especially children, have access to it.  It will also cause or worsen constipation, so you may want to consider taking an over-the-counter stool softener while you are taking this medication. ° °Please see you dentist as soon as possible; only a dentist will be able to fix your problem(s).  Please see below for dental follow up options. ° °Return to the ED if you develop worsening pain, fever, pus/drainage, difficulty  breathing, or other symptoms that concern you. °

## 2018-10-13 NOTE — ED Provider Notes (Signed)
[facility] Emergency Department Provider Note  ____________________________________________   First MD Initiated Contact with Patient 10/13/18 [phone]     (approximate)  I have reviewed the triage vital signs and the nursing notes.   HISTORY  Chief Complaint Dental Pain    HPI Randy Michael. is a 28 y.o. male with medical history as listed below who presents for evaluation of gradually worsening pain and swelling in the right lower part of his jaw and gums.  He has had similar issues in the past.  He said that he has a crown and feels like there might be an infection around it.  It used to help when he bit down on it to relieve the pain but now it is painful and swollen around in that area.  He does not have any trouble breathing or swallowing.  He denies fever/chills, chest pain, shortness of breath, nausea, vomiting, and abdominal pain.  The pain is both sharp and aching and constant and is make it difficult for him to sleep.  He had a back tooth on the right lower jaw pulled out by a dentist about a year ago but he has not been back.  He claims that he has called a dentist but because of the COVID-19 pandemic he has not been able to get an appointment.         Past Medical History:  Diagnosis Date  . Chronic dental infection     Patient Active Problem List   Diagnosis Date Noted  . Scalp laceration 06/14/2017    History reviewed. No pertinent surgical history.  Prior to Admission medications   Medication Sig Start Date End Date Taking? Authorizing Provider  clindamycin (CLEOCIN) 150 MG capsule Take 3 capsules (450 mg total) by mouth 3 (three) times daily for 12 days. 10/13/18 10/25/18  Loleta Rose, MD  oxyCODONE-acetaminophen (PERCOCET) 5-325 MG tablet Take 2 tablets by mouth every 6 (six) hours as needed for severe pain. 10/13/18   Loleta Rose, MD    Allergies Sulfa antibiotics; Tramadol; and Aspirin  History reviewed. No pertinent family  history.  Social History Social History   Tobacco Use  . Smoking status: Current Every Day Smoker    Packs/day: 1.00    Types: Cigarettes  . Smokeless tobacco: Never Used  Substance Use Topics  . Alcohol use: Yes  . Drug use: Yes    Types: Marijuana    Review of Systems Constitutional: No fever/chills Eyes: No visual changes. ENT: Dental pain and swelling in the right lower jaw as described above. Cardiovascular: Denies chest pain. Respiratory: Denies shortness of breath. Gastrointestinal: No abdominal pain.  No nausea, no vomiting.   Neurological: Negative for headaches, focal weakness or numbness.   ____________________________________________   PHYSICAL EXAM:  VITAL SIGNS: ED Triage Vitals  Enc Vitals Group     BP 10/13/18 0324 (!) 159/108     Pulse Rate 10/13/18 0324 (!) 109     Resp 10/13/18 0324 16     Temp 10/13/18 0324 97.9 F (36.6 C)     Temp Source 10/13/18 0324 Oral     SpO2 10/13/18 0324 100 %     Weight 10/13/18 0320 70.3 kg (155 lb)     Height 10/13/18 0320 1.803 m ( )     Head Circumference --      Peak Flow --      Pain Score 10/13/18 0319 8     Pain Loc --  Pain Edu? --      Excl. in GC? --     Constitutional: Alert and oriented.  Generally well-appearing but does appear uncomfortable. Eyes: Conjunctivae are normal.  Head: Atraumatic. Nose: No congestion/rhinnorhea. Mouth/Throat: Mucous membranes are moist.  The patient has extensive chronic dental decay and some prior fillings including on the right lower jaw at the area of the greatest tenderness.  He has some swelling of the gums and some erythema and is very tender to palpation.  There is some swelling of the lower jaw around the same area consistent with an odontogenic infection but there is no significant facial cellulitis, no evidence of facial abscess, and no swelling beyond the area right around the dental location.  This seems to be around in the area of tooth #29 or 30.  He  has no swelling or induration below the tongue, no difficulty speaking, swallowing, nor tolerating secretions, and no evidence of abscess in the back of the throat such as a peritonsillar abscess.  He has not tripoding. Neck: No stridor.  No meningeal signs.   Respiratory: Normal respiratory effort.  No retractions.  Neurologic:  Normal speech and language. No gross focal neurologic deficits are appreciated.  Skin:  Skin is warm, dry and intact. No rash noted. Psychiatric: Mood and affect are normal. Speech and behavior are normal.  ____________________________________________   LABS (all labs ordered are listed, but only abnormal results are displayed)  Labs Reviewed - No data to display ____________________________________________  EKG  No indication for EKG ____________________________________________  RADIOLOGY   ED MD interpretation: No indication for imaging  Official radiology report(s): No results found.  ____________________________________________   PROCEDURES   Procedure(s) performed (including Critical Care):  Procedures   ____________________________________________   INITIAL IMPRESSION / MDM / ASSESSMENT AND PLAN / ED COURSE  As part of my medical decision making, I reviewed the following data within the electronic MEDICAL RECORD NUMBER Nursing notes reviewed and incorporated, Old chart reviewed, Notes from prior ED visits and [place] Controlled Substance Database      Randy Michael. was evaluated in Emergency Department on 10/13/2018 for the symptoms described in the history of present illness. He was evaluated in the context of the global COVID-19 pandemic, which necessitated consideration that the patient might be at risk for infection with the SARS-CoV-2 virus that causes COVID-19. Institutional protocols and algorithms that pertain to the evaluation of patients at risk for COVID-19 are in a state of rapid change based on information released by regulatory  bodies including the CDC and federal and state organizations. These policies and algorithms were followed during the patient's care in the ED.  Differential diagnosis includes, but is not limited to, dental infection, Ludwig's angina, facial infection such as cellulitis or abscess, less likely neck abscess or infection.  The patient is well-appearing and in no distress with stable vital signs (his initial triage tachycardia has resolved).  He has chronic dental issues which have been worsening recently and he has what appears to be a localized dental or odontogenic infection.  There is no indication that he would benefit from CT scan at this time as there is no significant involvement of the face nor neck and the swelling seems to be isolated to the area right around the affected tooth.  I will treat with clindamycin 450 mg 3 times a day x12 days as well as a dose of Decadron 10 mg p.o. to help with the swelling.  I stressed to him  how important it is that he follow-up with a dentist the next available opportunity and provided the dental resource guide.  I gave strict return precautions and he understands and agrees with the plan.      ____________________________________________  FINAL CLINICAL IMPRESSION(S) / ED DIAGNOSES  Final diagnoses:  Dental infection     MEDICATIONS GIVEN DURING THIS VISIT:  Medications  clindamycin (CLEOCIN) capsule 450 mg (has no administration in time range)  oxyCODONE-acetaminophen (PERCOCET/ROXICET) 5-325 MG per tablet 2 tablet (has no administration in time range)  dexamethasone (DECADRON) 10 MG/ML injection for Pediatric ORAL use 10 mg (has no administration in time range)     ED Discharge Orders         Ordered    clindamycin (CLEOCIN) 150 MG capsule  3 times daily     10/13/18 0337    oxyCODONE-acetaminophen (PERCOCET) 5-325 MG tablet  Every 6 hours PRN     10/13/18 [id number]           Note:  This document was prepared using Dragon voice recognition  software and may include unintentional dictation errors.   Loleta RoseForbach, Brinleigh Tew, MD 10/13/18 [phone]
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
## 2019-04-07 ENCOUNTER — Other Ambulatory Visit: Payer: Self-pay

## 2019-04-07 ENCOUNTER — Encounter: Payer: Self-pay | Admitting: *Deleted

## 2019-04-07 ENCOUNTER — Emergency Department: Payer: Self-pay

## 2019-04-07 ENCOUNTER — Emergency Department
Admission: EM | Admit: 2019-04-07 | Discharge: 2019-04-08 | Disposition: A | Discharge status: Home / Self Care | Payer: Self-pay | Attending: Emergency Medicine | Admitting: Emergency Medicine

## 2019-04-07 DIAGNOSIS — S0990XA Unspecified injury of head, initial encounter: Secondary | ICD-10-CM | POA: Insufficient documentation

## 2019-04-07 DIAGNOSIS — F1990 Other psychoactive substance use, unspecified, uncomplicated: Secondary | ICD-10-CM

## 2019-04-07 DIAGNOSIS — Y939 Activity, unspecified: Secondary | ICD-10-CM | POA: Insufficient documentation

## 2019-04-07 DIAGNOSIS — Y929 Unspecified place or not applicable: Secondary | ICD-10-CM | POA: Insufficient documentation

## 2019-04-07 DIAGNOSIS — F1210 Cannabis abuse, uncomplicated: Secondary | ICD-10-CM | POA: Insufficient documentation

## 2019-04-07 DIAGNOSIS — Y999 Unspecified external cause status: Secondary | ICD-10-CM | POA: Insufficient documentation

## 2019-04-07 DIAGNOSIS — F10120 Alcohol abuse with intoxication, uncomplicated: Secondary | ICD-10-CM | POA: Insufficient documentation

## 2019-04-07 DIAGNOSIS — F17210 Nicotine dependence, cigarettes, uncomplicated: Secondary | ICD-10-CM | POA: Insufficient documentation

## 2019-04-07 LAB — COMPREHENSIVE METABOLIC PANEL
ALT: 32 U/L (ref 0–44)
AST: 42 U/L — ABNORMAL HIGH (ref 15–41)
Albumin: 5.2 g/dL — ABNORMAL HIGH (ref 3.5–5.0)
Alkaline Phosphatase: 121 U/L (ref 38–126)
Anion gap: 14 (ref 5–15)
BUN: 11 mg/dL (ref 6–20)
CO2: 25 mmol/L (ref 22–32)
Calcium: 9.7 mg/dL (ref 8.9–10.3)
Chloride: 104 mmol/L (ref 98–111)
Creatinine, Ser: 0.9 mg/dL (ref 0.61–1.24)
GFR calc Af Amer: >60 mL/min (ref >60)
GFR calc non Af Amer: >60 mL/min (ref >60)
Glucose, Bld: 80 mg/dL (ref 70–99)
Potassium: 3.9 mmol/L (ref 3.5–5.1)
Sodium: 143 mmol/L (ref 135–145)
Total Bilirubin: 1 mg/dL (ref 0.3–1.2)
Total Protein: 8.4 g/dL — ABNORMAL HIGH (ref 6.5–8.1)

## 2019-04-07 LAB — SALICYLATE LEVEL: Salicylate Lvl: <7 mg/dL (ref 2.8–30.0)

## 2019-04-07 LAB — ETHANOL: Alcohol, Ethyl (B): 193 mg/dL — ABNORMAL HIGH (ref <10)

## 2019-04-07 LAB — ACETAMINOPHEN LEVEL: Acetaminophen (Tylenol), Serum: <10 ug/mL — ABNORMAL LOW (ref 10–30)
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
## 2019-04-07 NOTE — ED Notes (Signed)
Pt's contact person left number Wilburn Cornelia [phone]); st that she feels like pt would be less cooperative and want to leave if she comes back; st she is aware of pt using fentanyl, xanax, and ETOH
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
## 2019-04-08 NOTE — ED Notes (Addendum)
PT more easily aroused at this time, less combative, and speech more clear. Able to ambulate in room.  Requesting water. PT given water. Will let MD know pt status.
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
## 2020-01-02 ENCOUNTER — Emergency Department
Admission: EM | Admit: 2020-01-02 | Discharge: 2020-01-02 | Disposition: A | Discharge status: Home / Self Care | Payer: Self-pay | Attending: Emergency Medicine | Admitting: Emergency Medicine

## 2020-01-02 ENCOUNTER — Encounter: Payer: Self-pay | Admitting: Emergency Medicine

## 2020-01-02 ENCOUNTER — Other Ambulatory Visit: Payer: Self-pay

## 2020-01-02 DIAGNOSIS — R5383 Other fatigue: Secondary | ICD-10-CM | POA: Insufficient documentation

## 2020-01-02 DIAGNOSIS — R509 Fever, unspecified: Secondary | ICD-10-CM | POA: Insufficient documentation

## 2020-01-02 DIAGNOSIS — Z5321 Procedure and treatment not carried out due to patient leaving prior to being seen by health care provider: Secondary | ICD-10-CM | POA: Insufficient documentation

## 2020-01-02 DIAGNOSIS — R531 Weakness: Secondary | ICD-10-CM | POA: Insufficient documentation

## 2020-01-02 DIAGNOSIS — R0981 Nasal congestion: Secondary | ICD-10-CM | POA: Insufficient documentation

## 2020-01-02 DIAGNOSIS — R079 Chest pain, unspecified: Secondary | ICD-10-CM | POA: Insufficient documentation

## 2020-01-02 DIAGNOSIS — R0609 Other forms of dyspnea: Secondary | ICD-10-CM | POA: Insufficient documentation

## 2020-01-02 LAB — BASIC METABOLIC PANEL
Anion gap: 8 (ref 5–15)
BUN: 14 mg/dL (ref 6–20)
CO2: 25 mmol/L (ref 22–32)
Calcium: 8.9 mg/dL (ref 8.9–10.3)
Chloride: 103 mmol/L (ref 98–111)
Creatinine, Ser: 0.87 mg/dL (ref 0.61–1.24)
GFR calc Af Amer: >60 mL/min (ref >60)
GFR calc non Af Amer: >60 mL/min (ref >60)
Glucose, Bld: 117 mg/dL — ABNORMAL HIGH (ref 70–99)
Potassium: 3.9 mmol/L (ref 3.5–5.1)
Sodium: 136 mmol/L (ref 135–145)

## 2020-01-02 LAB — URINALYSIS, COMPLETE (UACMP) WITH MICROSCOPIC
Bacteria, UA: NONE SEEN
Bilirubin Urine: NEGATIVE
Glucose, UA: NEGATIVE mg/dL
Hgb urine dipstick: NEGATIVE
Ketones, ur: NEGATIVE mg/dL
Leukocytes,Ua: NEGATIVE
Nitrite: NEGATIVE
Protein, ur: NEGATIVE mg/dL
Specific Gravity, Urine: 1.027 (ref 1.005–1.030)
Squamous Epithelial / HPF: NONE SEEN (ref 0–5)
pH: 5 (ref 5.0–8.0)

## 2020-01-02 LAB — CBC
HCT: 42.6 % (ref 39.0–52.0)
Hemoglobin: 14.6 g/dL (ref 13.0–17.0)
MCH: 30 pg (ref 26.0–34.0)
MCHC: 34.3 g/dL (ref 30.0–36.0)
MCV: 87.5 fL (ref 80.0–100.0)
Platelets: 183 10*3/uL (ref 150–400)
RBC: 4.87 MIL/uL (ref 4.22–5.81)
RDW: 12.6 % (ref 11.5–15.5)
WBC: 11.1 10*3/uL — ABNORMAL HIGH (ref 4.0–10.5)
nRBC: 0 % (ref 0.0–0.2)

## 2020-01-02 NOTE — ED Notes (Signed)
No answer when called several times from lobby 

## 2020-01-02 NOTE — ED Triage Notes (Signed)
Here for weakness and fatigue since this AM with chills. No know fever.  Does have small abscess like area to chest that is tender.  Unlabored. VSS.  "I just feel like I don't have any strength".  Denies any other pain except where abscess like area.  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

## 2020-01-02 NOTE — ED Notes (Signed)
Called several times from lobby with no answer 

## 2020-01-02 NOTE — ED Triage Notes (Signed)
First Nurse Note:  Arrives c/o difficulty taking a deep breath and sore to chest area.  Sinus congestion noted.  Patient speaking in full sentences.  No SOB/ DOE .  Red area seen to lower chest.  No drainage noted.

## 2020-01-03 ENCOUNTER — Telehealth: Payer: Self-pay | Admitting: Emergency Medicine
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
## 2021-11-07 ENCOUNTER — Other Ambulatory Visit: Payer: Self-pay

## 2021-11-07 ENCOUNTER — Emergency Department: Payer: No Typology Code available for payment source

## 2021-11-07 ENCOUNTER — Emergency Department
Admission: EM | Admit: 2021-11-07 | Discharge: 2021-11-07 | Disposition: A | Discharge status: Home / Self Care | Payer: No Typology Code available for payment source | Attending: Emergency Medicine | Admitting: Emergency Medicine

## 2021-11-07 DIAGNOSIS — S20212A Contusion of left front wall of thorax, initial encounter: Secondary | ICD-10-CM | POA: Diagnosis not present

## 2021-11-07 DIAGNOSIS — S0003XA Contusion of scalp, initial encounter: Secondary | ICD-10-CM | POA: Insufficient documentation

## 2021-11-07 DIAGNOSIS — Y92410 Unspecified street and highway as the place of occurrence of the external cause: Secondary | ICD-10-CM | POA: Insufficient documentation

## 2021-11-07 DIAGNOSIS — M542 Cervicalgia: Secondary | ICD-10-CM | POA: Diagnosis not present

## 2021-11-07 DIAGNOSIS — S0990XA Unspecified injury of head, initial encounter: Secondary | ICD-10-CM | POA: Diagnosis present

## 2021-11-07 MED ORDER — MELOXICAM 7.5 MG PO TABS
15.0000 mg | ORAL_TABLET | Freq: Once | ORAL | Status: AC
  Administered 2021-11-07: 15 mg via ORAL
  Filled 2021-11-07: qty 2

## 2021-11-07 MED ORDER — MELOXICAM 15 MG PO TABS: 15.0000 mg | ORAL_TABLET | Freq: Every day | ORAL | 0 refills | Status: AC

## 2021-11-07 MED ORDER — METHOCARBAMOL 500 MG PO TABS
1000.0000 mg | ORAL_TABLET | Freq: Once | ORAL | Status: AC
  Administered 2021-11-07: 1000 mg via ORAL
  Filled 2021-11-07: qty 2

## 2021-11-07 MED ORDER — METHOCARBAMOL 500 MG PO TABS
500.0000 mg | ORAL_TABLET | Freq: Four times a day (QID) | ORAL | 0 refills | Status: DC
Start: 2021-11-07 — End: 2023-04-20
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
## 2021-11-07 NOTE — ED Notes (Signed)
Pt discharge information reviewed. Pt understands need for follow up care and when to return if symptoms worsen. All questions answered. Pt is alert and oriented with even and regular respirations. Pt is seen ambulating out of department with string steady gait.   
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
## 2023-04-20 ENCOUNTER — Other Ambulatory Visit: Payer: Self-pay

## 2023-04-20 ENCOUNTER — Emergency Department
Admission: EM | Admit: 2023-04-20 | Discharge: 2023-04-20 | Disposition: A | Discharge status: Home / Self Care | Payer: Self-pay | Attending: Emergency Medicine | Admitting: Emergency Medicine

## 2023-04-20 DIAGNOSIS — F17290 Nicotine dependence, other tobacco product, uncomplicated: Secondary | ICD-10-CM | POA: Insufficient documentation

## 2023-04-20 DIAGNOSIS — M791 Myalgia, unspecified site: Secondary | ICD-10-CM | POA: Insufficient documentation

## 2023-04-20 DIAGNOSIS — Z1152 Encounter for screening for COVID-19: Secondary | ICD-10-CM | POA: Insufficient documentation

## 2023-04-20 DIAGNOSIS — R52 Pain, unspecified: Secondary | ICD-10-CM

## 2023-04-20 LAB — URINALYSIS, ROUTINE W REFLEX MICROSCOPIC
Bacteria, UA: NONE SEEN
Bilirubin Urine: NEGATIVE
Glucose, UA: NEGATIVE mg/dL
Hgb urine dipstick: NEGATIVE
Ketones, ur: NEGATIVE mg/dL
Leukocytes,Ua: NEGATIVE
Nitrite: NEGATIVE
Protein, ur: 30 mg/dL — AB
Specific Gravity, Urine: 1.04 — ABNORMAL HIGH (ref 1.005–1.030)
Squamous Epithelial / HPF: 0 /[HPF] (ref 0–5)
pH: 5 (ref 5.0–8.0)

## 2023-04-20 LAB — RESP PANEL BY RT-PCR (RSV, FLU A&B, COVID)  RVPGX2
Influenza A by PCR: NEGATIVE
Influenza B by PCR: NEGATIVE
Resp Syncytial Virus by PCR: NEGATIVE
SARS Coronavirus 2 by RT PCR: NEGATIVE

## 2023-04-20 NOTE — ED Provider Notes (Signed)
[facility] Provider Note    Event Date/Time   First MD Initiated Contact with Patient 04/20/23 [phone]     (approximate)   History   Back Pain   HPI  Randy Michael. is a 32 y.o. male presents emergency department with low back pain states goes into the back of his legs.  States feels a little achy.  Denies burning with urination but is having some suprapubic pain.  No vomiting or diarrhea.  Mild cough patient is a smoker.  No chest pain shortness of breath      Physical Exam   Triage Vital Signs: ED Triage Vitals  Encounter Vitals Group     BP 04/20/23 0755 (!) 131/98     Systolic BP Percentile --      Diastolic BP Percentile --      Pulse Rate 04/20/23 0755 91     Resp 04/20/23 0755 18     Temp 04/20/23 0755 97.8 F (36.6 C)     Temp src --      SpO2 04/20/23 0755 99 %     Weight 04/20/23 0759 154 lb 15.7 oz (70.3 kg)     Height 04/20/23 0759 5\' 11"  (1.803 m)     Head Circumference --      Peak Flow --      Pain Score 04/20/23 0754 6     Pain Loc --      Pain Education --      Exclude from Growth Chart --     Most recent vital signs: Vitals:   04/20/23 0755  BP: (!) 131/98  Pulse: 91  Resp: 18  Temp: 97.8 F (36.6 C)  SpO2: 99%     General: Awake, no distress.   CV:  Good peripheral perfusion. regular rate and  rhythm Resp:  Normal effort. Lungs cta Abd:  No distention.  Nontender, no CVA tenderness noted Other:      ED Results / Procedures / Treatments   Labs (all labs ordered are listed, but only abnormal results are displayed) Labs Reviewed  URINALYSIS, ROUTINE W REFLEX MICROSCOPIC - Abnormal; Notable for the following components:      Result Value   Color, Urine AMBER (*)    APPearance CLEAR (*)    Specific Gravity, Urine 1.040 (*)    Protein, ur 30 (*)    All other components within normal limits  RESP PANEL BY RT-PCR (RSV, FLU A&B, COVID)  [id number]      EKG     RADIOLOGY     PROCEDURES:   Procedures   MEDICATIONS ORDERED IN ED: Medications - No data to display   IMPRESSION / MDM / ASSESSMENT AND PLAN / ED COURSE  I reviewed the triage vital signs and the nursing notes.                              Differential diagnosis includes, but is not limited to, UTI, COVID, influenza, pyelonephritis, kidney stone, prostatitis  Patient's presentation is most consistent with acute illness / injury with system symptoms.   UA, respiratory panel ordered  UA reassuring, respiratory panel reassuring  Patient was encouraged to take Tylenol/ibuprofen for body aches.  Drink plenty of water.  Follow-up with his regular doctor if not improving to 3 days.  Return if worsening.  He is in agreement treatment plan.  Discharged stable condition.   FINAL CLINICAL IMPRESSION(S) / ED DIAGNOSES  Final diagnoses:  Body aches     Rx / DC Orders   ED Discharge Orders     None        Note:  This document was prepared using Dragon voice recognition software and may include unintentional dictation errors.    Faythe Ghee, PA-C 04/20/23 1015    Chesley Noon, MD 04/20/23 [phone]

## 2023-04-20 NOTE — ED Triage Notes (Signed)
Pt comes with c/o lower back pain that radiates to back of legs. Pt states this all started Friday. Pt denies any injuries. Pt denies any urinary symptoms.

## 2023-05-06 ENCOUNTER — Other Ambulatory Visit (HOSPITAL_BASED_OUTPATIENT_CLINIC_OR_DEPARTMENT_OTHER): Payer: Self-pay

## 2023-05-06 DIAGNOSIS — R0609 Other forms of dyspnea: Secondary | ICD-10-CM

## 2023-05-07 ENCOUNTER — Ambulatory Visit (INDEPENDENT_AMBULATORY_CARE_PROVIDER_SITE_OTHER): Payer: Self-pay | Admitting: Internal Medicine

## 2023-05-07 DIAGNOSIS — R0609 Other forms of dyspnea: Secondary | ICD-10-CM

## 2023-05-07 LAB — PULMONARY FUNCTION TEST
FEF 25-75 Post: 1.2 L/s
FEF 25-75 Pre: 1.19 L/s
FEF2575-%Change-Post: 1 %
FEF2575-%Pred-Post: 29 %
FEF2575-%Pred-Pre: 29 %
FEV1-%Change-Post: 3 %
FEV1-%Pred-Post: 62 %
FEV1-%Pred-Pre: 60 %
FEV1-Post: 2.56 L
FEV1-Pre: 2.48 L
FEV1FVC-%Change-Post: 11 %
FEV1FVC-%Pred-Pre: 61 %
FEV6-%Change-Post: -6 %
FEV6-%Pred-Post: 91 %
FEV6-%Pred-Pre: 97 %
FEV6-Post: 4.57 L
FEV6-Pre: 4.87 L
FEV6FVC-%Change-Post: 1 %
FEV6FVC-%Pred-Post: 100 %
FEV6FVC-%Pred-Pre: 99 %
FVC-%Change-Post: -7 %
FVC-%Pred-Post: 91 %
FVC-%Pred-Pre: 98 %
FVC-Post: 4.64 L
FVC-Pre: 5.01 L
Post FEV1/FVC ratio: 55 %
Post FEV6/FVC ratio: 98 %
Pre FEV1/FVC ratio: 50 %
Pre FEV6/FVC Ratio: 97 %

## 2023-05-07 NOTE — Patient Instructions (Signed)
Pre/Post Spirometry Performed Today. 

## 2023-05-07 NOTE — Progress Notes (Signed)
Pre/Post Spirometry Performed Today. 

## 2023-06-10 ENCOUNTER — Ambulatory Visit
Admission: EM | Admit: 2023-06-10 | Discharge: 2023-06-10 | Disposition: A | Discharge status: Home / Self Care | Payer: Self-pay | Attending: Emergency Medicine | Admitting: Emergency Medicine

## 2023-06-10 DIAGNOSIS — H6691 Otitis media, unspecified, right ear: Secondary | ICD-10-CM

## 2023-06-10 DIAGNOSIS — H6693 Otitis media, unspecified, bilateral: Secondary | ICD-10-CM

## 2023-06-10 DIAGNOSIS — H6692 Otitis media, unspecified, left ear: Secondary | ICD-10-CM

## 2023-06-10 DIAGNOSIS — H7292 Unspecified perforation of tympanic membrane, left ear: Secondary | ICD-10-CM

## 2023-06-10 MED ORDER — AMOXICILLIN-POT CLAVULANATE 875-125 MG PO TABS: 1.0000 | ORAL_TABLET | Freq: Two times a day (BID) | ORAL | 0 refills | Status: AC

## 2023-06-10 NOTE — Discharge Instructions (Addendum)
Take the Augmentin as directed.  Avoid getting water or other fluids in your left ear.  Follow-up with your primary care provider or an ENT.

## 2023-06-10 NOTE — ED Triage Notes (Signed)
Patient to Urgent Care with complaints of left sided ear fullness/ muffled hearing. Some discomfort when coughing.  Symptoms started x1 week.

## 2023-06-10 NOTE — ED Provider Notes (Signed)
Randy Michael    CSN: [id number] Arrival date & time: 06/10/23  1522      History   Chief Complaint Chief Complaint  Patient presents with   Otalgia    HPI Randy Costilow. is a 32 y.o. male.  Patient presents with left side ear fullness and muffled hearing x 1 week.  He states it makes the sound like he has a seashell covering his ear.  He has ear pain when he coughs.  He denies fever, ear drainage, sore throat, shortness of breath, or other symptoms.  No treatments at home.  The history is provided by the patient and medical records.    Past Medical History:  Diagnosis Date   Chronic dental infection     Patient Active Problem List   Diagnosis Date Noted   Scalp laceration 06/14/2017    History reviewed. No pertinent surgical history.     Home Medications    Prior to Admission medications   Medication Sig Start Date End Date Taking? Authorizing Provider  amoxicillin-clavulanate (AUGMENTIN) 875-125 MG tablet Take 1 tablet by mouth every 12 (twelve) hours for 10 days. 06/10/23 06/20/23 Yes Mickie Bail, NP    Family History History reviewed. No pertinent family history.  Social History Social History   Tobacco Use   Smoking status: Every Day    Current packs/day: 1.00    Types: Cigarettes   Smokeless tobacco: Never  Substance Use Topics   Alcohol use: Yes   Drug use: Yes    Types: Marijuana     Allergies   Sulfa antibiotics, Tramadol, and Aspirin   Review of Systems Review of Systems  Constitutional:  Negative for chills and fever.  HENT:  Positive for ear pain. Negative for ear discharge and sore throat.   Respiratory:  Positive for cough. Negative for shortness of breath.      Physical Exam Triage Vital Signs ED Triage Vitals  Encounter Vitals Group     BP 06/10/23 1601 123/84     Systolic BP Percentile --      Diastolic BP Percentile --      Pulse Rate 06/10/23 1601 89     Resp 06/10/23 1601 18     Temp 06/10/23 1601 98  F (36.7 C)     Temp src --      SpO2 06/10/23 1601 96 %     Weight --      Height --      Head Circumference --      Peak Flow --      Pain Score 06/10/23 1605 0     Pain Loc --      Pain Education --      Exclude from Growth Chart --    No data found.  Updated Vital Signs BP 123/84   Pulse 89   Temp 98 F (36.7 C)   Resp 18   SpO2 96%   Visual Acuity Right Eye Distance:   Left Eye Distance:   Bilateral Distance:    Right Eye Near:   Left Eye Near:    Bilateral Near:     Physical Exam Constitutional:      General: He is not in acute distress. HENT:     Right Ear: Ear canal normal. Tympanic membrane is erythematous.     Left Ear: Ear canal normal. Tympanic membrane is perforated and erythematous.     Nose: Nose normal.     Mouth/Throat:     Mouth:  Mucous membranes are moist.     Pharynx: Oropharynx is clear.  Cardiovascular:     Rate and Rhythm: Normal rate and regular rhythm.     Heart sounds: Normal heart sounds.  Pulmonary:     Effort: Pulmonary effort is normal. No respiratory distress.     Breath sounds: Normal breath sounds.  Skin:    General: Skin is warm and dry.  Neurological:     Mental Status: He is alert.      UC Treatments / Results  Labs (all labs ordered are listed, but only abnormal results are displayed) Labs Reviewed - No data to display  EKG   Radiology No results found.  Procedures Procedures (including critical care time)  Medications Ordered in UC Medications - No data to display  Initial Impression / Assessment and Plan / UC Course  I have reviewed the triage vital signs and the nursing notes.  Pertinent labs & imaging results that were available during my care of the patient were reviewed by me and considered in my medical decision making (see chart for details).    Left otitis media with rupture of tympanic membrane, right otitis media.  Treating with Augmentin.  Instructed patient to avoid water in his left ear.   Instructed him to follow-up with his PCP or any ENT.  Education provided on eardrum rupture and ear infection.  He agrees to plan of care.    Final Clinical Impressions(s) / UC Diagnoses   Final diagnoses:  Otitis media with rupture of tympanic membrane, left  Right otitis media, unspecified otitis media type     Discharge Instructions      Take the Augmentin as directed.  Avoid getting water or other fluids in your left ear.  Follow-up with your primary care provider or an ENT.     ED Prescriptions     Medication Sig Dispense Auth. Provider   amoxicillin-clavulanate (AUGMENTIN) 875-125 MG tablet Take 1 tablet by mouth every 12 (twelve) hours for 10 days. 20 tablet Mickie Bail, NP      PDMP not reviewed this encounter.   Mickie Bail, NP 06/10/23 [phone]

## 2023-12-13 ENCOUNTER — Other Ambulatory Visit: Payer: Self-pay

## 2023-12-13 ENCOUNTER — Emergency Department
Admission: EM | Admit: 2023-12-13 | Discharge: 2023-12-13 | Disposition: A | Discharge status: Home / Self Care | Payer: Self-pay | Attending: Emergency Medicine | Admitting: Emergency Medicine

## 2023-12-13 DIAGNOSIS — M461 Sacroiliitis, not elsewhere classified: Secondary | ICD-10-CM | POA: Insufficient documentation

## 2023-12-13 DIAGNOSIS — K649 Unspecified hemorrhoids: Secondary | ICD-10-CM | POA: Insufficient documentation

## 2023-12-13 DIAGNOSIS — M545 Low back pain, unspecified: Secondary | ICD-10-CM

## 2023-12-13 MED ORDER — LIDOCAINE 5 % EX PTCH
1.0000 | MEDICATED_PATCH | CUTANEOUS | Status: DC
Start: 2023-12-13 — End: 2023-12-13
  Administered 2023-12-13: 1 via TRANSDERMAL
  Filled 2023-12-13: qty 1

## 2023-12-13 MED ORDER — NAPROXEN 500 MG PO TABS: 500.0000 mg | ORAL_TABLET | Freq: Two times a day (BID) | ORAL | 0 refills | Status: AC

## 2023-12-13 MED ORDER — LIDOCAINE 5 % EX PTCH: 1.0000 | MEDICATED_PATCH | Freq: Two times a day (BID) | CUTANEOUS | 0 refills | Status: AC

## 2023-12-13 MED ORDER — DOCUSATE SODIUM 100 MG PO CAPS: 100.0000 mg | ORAL_CAPSULE | Freq: Two times a day (BID) | ORAL | 0 refills | Status: AC

## 2023-12-13 MED ORDER — KETOROLAC TROMETHAMINE 15 MG/ML IJ SOLN
15.0000 mg | Freq: Once | INTRAMUSCULAR | Status: AC
  Administered 2023-12-13: 15 mg via INTRAMUSCULAR
  Filled 2023-12-13: qty 1

## 2023-12-13 NOTE — ED Provider Notes (Signed)
 [facility] Provider Note    Event Date/Time   First MD Initiated Contact with Patient 12/13/23 1341     (approximate)   History   Back Pain   HPI  Randy Michael. is a 33 y.o. male who presents today for evaluation of right lower back pain for the past 3 to 4 days.  Patient also notes that he has had a hemorrhoid that was bleeding when he straining to use the bathroom.  He reports that when he pooped it felt like he had 3 little marbles sticking out of his bottom that went back into his bilateral legs he stopped straining.  He reports that he was working in [employer] and was lifting heavy boxes.  He denies any specific injuries.  He has not had any abdominal pain.  No urinary fecal incontinence or retention.  He is still able to ambulate.  No fevers or chills.  Patient Active Problem List   Diagnosis Date Noted   Scalp laceration 06/14/2017          Physical Exam   Triage Vital Signs: ED Triage Vitals  Encounter Vitals Group     BP 12/13/23 1334 (!) 152/101     Girls Systolic BP Percentile --      Girls Diastolic BP Percentile --      Boys Systolic BP Percentile --      Boys Diastolic BP Percentile --      Pulse Rate 12/13/23 1332 73     Resp 12/13/23 1332 17     Temp 12/13/23 1332 98.1 F (36.7 C)     Temp Source 12/13/23 1332 Oral     SpO2 12/13/23 1332 100 %     Weight 12/13/23 1333 160 lb (72.6 kg)     Height 12/13/23 1333 5' 11 (1.803 m)     Head Circumference --      Peak Flow --      Pain Score 12/13/23 1333 8     Pain Loc --      Pain Education --      Exclude from Growth Chart --     Most recent vital signs: Vitals:   12/13/23 1332 12/13/23 1334  BP:  (!) 152/101  Pulse: 73   Resp: 17   Temp: 98.1 F (36.7 C)   SpO2: 100%     Physical Exam Vitals and nursing note reviewed.  Constitutional:      General: Awake and alert. No acute distress.    Appearance: Normal appearance. The patient is normal weight.   HENT:     Head: Normocephalic and atraumatic.     Mouth: Mucous membranes are moist.  Eyes:     General: PERRL. Normal EOMs        Right eye: No discharge.        Left eye: No discharge.     Conjunctiva/sclera: Conjunctivae normal.  Cardiovascular:     Rate and Rhythm: Normal rate and regular rhythm.     Pulses: Normal pulses.  Pulmonary:     Effort: Pulmonary effort is normal. No respiratory distress.     Breath sounds: Normal breath sounds.  Abdominal:     Abdomen is soft. There is no abdominal tenderness. No rebound or guarding. No distention.  Rectal exam with 1 external hemorrhoid noted, nonthrombosed Musculoskeletal:        General: No swelling. Normal range of motion.     Cervical back: Normal range of motion and  neck supple.  Back: No midline tenderness.  Tenderness to palpation to right lumbar paraspinal muscle area and right SI joint.  Strength and sensation 5/5 to bilateral lower extremities. Normal great toe extension against resistance. Normal sensation throughout feet. Normal patellar reflexes. Negative SLR and opposite SLR bilaterally.  Pain with figure-of-four test on the right Skin:    General: Skin is warm and dry.     Capillary Refill: Capillary refill takes less than 2 seconds.     Findings: No rash.  Neurological:     Mental Status: The patient is awake and alert.      ED Results / Procedures / Treatments   Labs (all labs ordered are listed, but only abnormal results are displayed) Labs Reviewed - No data to display   EKG     RADIOLOGY     PROCEDURES:  Critical Care performed:   Procedures   MEDICATIONS ORDERED IN ED: Medications  lidocaine  (LIDODERM ) 5 % 1 patch (1 patch Transdermal Patch Applied 12/13/23 1356)  ketorolac  (TORADOL ) 15 MG/ML injection 15 mg (15 mg Intramuscular Given 12/13/23 1358)     IMPRESSION / MDM / ASSESSMENT AND PLAN / ED COURSE  I reviewed the triage vital signs and the nursing notes.   Differential  diagnosis includes, but is not limited to, sacroiliitis, lumbar spasm, lumbar strain, hemorrhoid.  Patient is awake and alert, hemodynamically stable and afebrile.  He is nontoxic in appearance.  He has 5 out of 5 strength with intact sensation to extensor hallucis dorsiflexion and plantarflexion of bilateral lower extremities with normal patellar reflexes bilaterally. Most likely etiology at this point is muscle strain vs sacroileitis. No red flags to indicate patient is at risk for more auspicious process that would require urgent/emergent spinal imaging or subspecialty evaluation at this time. No major trauma, no midline tenderness, no history or physical exam findings to suggest cauda equina syndrome or spinal cord compression. No focal neurological deficits on exam. No constitutional symptoms or history of immunosuppression or IVDA to suggest potential for epidural abscess. Not anticoagulated, no history of bleeding diastasis to suggest risk for epidural hematoma. No chronic steroid use or advanced age or history of malignancy to suggest proclivity towards pathological fracture.  No abdominal pain or flank pain to suggest kidney stone, no history of kidney stone.  No fever or dysuria or CVAT to suggest pyelonephritis.  No chest pain, back pain, shortness of breath, neurological deficits, to suggest vascular catastrophe, and pulses are equal in all 4 extremities.  History of pain after lifting boxes for his job at [employer] is suspicious for musculoskeletal etiology/sacroiliitis given the tenderness over his right SI joint and pain with figure-of-four stretch.  Patient was treated with Toradol  and a Lidoderm  patch, I reviewed the patient's chart and he tolerated Toradol  on 08/06/15.   Rectal exam this was consistent with 1 external hemorrhoid, his story of having felt marbles when straining sounds like external hemorrhoids.  I recommended that he follow-up with gastroenterology and the appropriate  follow-up information was provided.  We discussed return precautions in the meantime.  Patient or stands and agrees with plan.  Discharged in stable condition.  Patient's presentation is most consistent with acute complicated illness / injury requiring diagnostic workup.     FINAL CLINICAL IMPRESSION(S) / ED DIAGNOSES   Final diagnoses:  Acute right-sided low back pain without sciatica  Sacroiliitis (HCC)  Hemorrhoids, unspecified hemorrhoid type     Rx / DC Orders   ED Discharge Orders  Ordered    docusate sodium (COLACE) 100 MG capsule  2 times daily        12/13/23 1427    naproxen (NAPROSYN) 500 MG tablet  2 times daily with meals        12/13/23 1427    lidocaine  (LIDODERM ) 5 %  Every 12 hours        12/13/23 1427             Note:  This document was prepared using Dragon voice recognition software and may include unintentional dictation errors.   Ivylynn Hoppes E, PA-C 12/13/23 1453    Willo Dunnings, MD 12/13/23 1535

## 2023-12-13 NOTE — ED Triage Notes (Signed)
 Pt sts that he is right side lower lumbar pain as well as Hemorid pain and bleeding.

## 2023-12-13 NOTE — Discharge Instructions (Signed)
 Please follow-up with gastroenterology.  Please return for any new, worsening, or change in symptoms or other concerns.  It was a pleasure caring for you today.
